# Patient Record
Sex: Male | Born: 1995 | Race: White | Hispanic: No | Marital: Single
Health system: Southern US, Community
[De-identification: ages and names within clinical notes are randomized; demographics above are authoritative.]

---
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
## 2019-12-29 ENCOUNTER — Ambulatory Visit: Payer: Managed Care, Other (non HMO) | Attending: Internal Medicine

## 2019-12-29 DIAGNOSIS — Z20822 Contact with and (suspected) exposure to covid-19: Secondary | ICD-10-CM

## 2019-12-31 ENCOUNTER — Ambulatory Visit: Payer: Managed Care, Other (non HMO) | Attending: Internal Medicine

## 2019-12-31 ENCOUNTER — Telehealth: Payer: Self-pay

## 2019-12-31 LAB — NOVEL CORONAVIRUS, NAA: SARS-CoV-2, NAA: DETECTED — AB

## 2019-12-31 NOTE — Telephone Encounter (Signed)
Patient called and he and his mother were told that his test for COVID-19 on 12/29/19 was positive and he can pass the germ to others. Mom states that he has HA and legs are achy. Symptom tier and treatment plan for each were read to Mom. Criteria for ending isolation were read to Mom. Good preventative practices were discussed. Mom verbalized understanding of all information. [place] [facility] will be notified.
